# Patient Record
Sex: Male | Born: 1970 | Hispanic: No | Marital: Married | Smoking: Never smoker
Health system: Southern US, Community
[De-identification: ages and names within clinical notes are randomized; demographics above are authoritative.]

## PROBLEM LIST (undated history)

## (undated) DIAGNOSIS — F419 Anxiety disorder, unspecified: Secondary | ICD-10-CM

## (undated) HISTORY — DX: Anxiety disorder, unspecified: F41.9

## (undated) HISTORY — PX: WISDOM TOOTH EXTRACTION: SHX21

---

## 2008-12-24 ENCOUNTER — Ambulatory Visit: Payer: Self-pay | Admitting: Internal Medicine

## 2008-12-24 LAB — CONVERTED CEMR LAB
BUN: 14 mg/dL (ref 6–23)
CO2: 30 meq/L (ref 19–32)
Cholesterol: 159 mg/dL (ref 0–200)
GFR calc Af Amer: 97 mL/min
Glucose, Bld: 83 mg/dL (ref 70–99)
HDL: 40.3 mg/dL (ref >39.0)
Potassium: 4.1 meq/L (ref 3.5–5.1)
Sodium: 139 meq/L (ref 135–145)
VLDL: 9 mg/dL (ref 0–40)

## 2010-01-18 ENCOUNTER — Ambulatory Visit: Payer: Self-pay | Admitting: Internal Medicine

## 2010-12-25 ENCOUNTER — Ambulatory Visit: Admit: 2010-12-25 | Payer: Self-pay | Admitting: Internal Medicine

## 2010-12-28 ENCOUNTER — Ambulatory Visit: Admit: 2010-12-28 | Payer: Self-pay | Admitting: Internal Medicine

## 2011-01-03 NOTE — Assessment & Plan Note (Signed)
Summary: NAUSEA/PT STATES HE PASSED OUT LAST PM, BETTER TODAY, WANTS T...   Vital Signs:  Patient profile:   40 year old male Height:      68 inches Weight:      153 pounds BMI:     23.35 O2 Sat:      97 % on Room air Temp:     99.2 degrees F oral Pulse rate:   92 / minute BP sitting:   118 / 72  (left arm) Cuff size:   regular  Vitals Entered By: Bill Salinas CMA (January 18, 2010 11:15 AM)  O2 Flow:  Room air CC: pt here for evaluation after passing out last night. Pt states he had feeling of nausea with headache and chills/ ab   Primary Care Provider:  Jacques Navy MD  CC:  pt here for evaluation after passing out last night. Pt states he had feeling of nausea with headache and chills/ ab.  History of Present Illness: Yesterday was feeling a little nauseated and ill. He got home and felt weak. Took a nap, awoke and took a shower was feel a little better. He went back to bed and felt a little queasy, felt chilled, had a headache. He got up for tea and then felt really nauseous and weak, felt he couldn't hold himself up and then he collapsed, unconscious for 20-30 seconds and then passed out again but awoke immediately. He felt much better. Has done much better since.   Current Medications (verified): 1)  None  Allergies (verified): No Known Drug Allergies PMH-FH-SH reviewed-no changes except otherwise noted  Review of Systems       The patient complains of anorexia, syncope, headaches, and abdominal pain.  The patient denies fever, weight loss, weight gain, decreased hearing, chest pain, dyspnea on exertion, prolonged cough, incontinence, transient blindness, difficulty walking, and enlarged lymph nodes.    Physical Exam  General:  Well-developed,well-nourished,in no acute distress; alert,appropriate and cooperative throughout examination Head:  normocephalic and atraumatic.   Eyes:  pupils equal, pupils round, corneas and lenses clear, and no injection.   Neck:   supple and full ROM.   Lungs:  normal respiratory effort and normal breath sounds.   Heart:  normal rate and regular rhythm.   Abdomen:  soft, non-tender, and normal bowel sounds.   Neurologic:  alert & oriented X3, cranial nerves II-XII intact, strength normal in all extremities, gait normal, and DTRs symmetrical and normal.  Normal station, normal tandem gait.    Impression & Recommendations:  Problem # 1:  GASTROENTERITIS, VIRAL, ACUTE (ICD-008.8) Patient with a hx c/w viral gastroeneteritis with an episode of vaso-vagal syncope. He had a normal neuro exam. His symptoms are already much better.   Plan - no further workup           BRAT diet for today.            Reassurance

## 2011-02-06 ENCOUNTER — Other Ambulatory Visit: Payer: Self-pay | Admitting: Internal Medicine

## 2011-02-06 ENCOUNTER — Encounter (INDEPENDENT_AMBULATORY_CARE_PROVIDER_SITE_OTHER): Payer: Self-pay | Admitting: *Deleted

## 2011-02-06 ENCOUNTER — Other Ambulatory Visit: Payer: Self-pay

## 2011-02-06 DIAGNOSIS — Z0000 Encounter for general adult medical examination without abnormal findings: Secondary | ICD-10-CM

## 2011-02-06 LAB — CBC WITH DIFFERENTIAL/PLATELET
Basophils Absolute: 0 10*3/uL (ref 0.0–0.1)
Basophils Relative: 0.5 % (ref 0.0–3.0)
Eosinophils Absolute: 0.2 10*3/uL (ref 0.0–0.7)
Hemoglobin: 14.3 g/dL (ref 13.0–17.0)
Lymphs Abs: 1.8 10*3/uL (ref 0.7–4.0)
MCHC: 34.2 g/dL (ref 30.0–36.0)
MCV: 88.5 fl (ref 78.0–100.0)
Monocytes Absolute: 0.5 10*3/uL (ref 0.1–1.0)
Neutro Abs: 4.1 10*3/uL (ref 1.4–7.7)
RBC: 4.73 Mil/uL (ref 4.22–5.81)
RDW: 12.8 % (ref 11.5–14.6)

## 2011-02-06 LAB — LIPID PANEL: Total CHOL/HDL Ratio: 4

## 2011-02-06 LAB — BASIC METABOLIC PANEL
CO2: 32 mEq/L (ref 19–32)
Calcium: 9 mg/dL (ref 8.4–10.5)
Chloride: 103 mEq/L (ref 96–112)
Glucose, Bld: 80 mg/dL (ref 70–99)
Potassium: 4.7 mEq/L (ref 3.5–5.1)
Sodium: 140 mEq/L (ref 135–145)

## 2011-02-06 LAB — URINALYSIS
Hgb urine dipstick: NEGATIVE
Ketones, ur: NEGATIVE
Total Protein, Urine: NEGATIVE
Urine Glucose: NEGATIVE

## 2011-02-06 LAB — HEPATIC FUNCTION PANEL
ALT: 17 U/L (ref 0–53)
AST: 23 U/L (ref 0–37)
Alkaline Phosphatase: 57 U/L (ref 39–117)
Bilirubin, Direct: 0.1 mg/dL (ref 0.0–0.3)
Total Protein: 6.3 g/dL (ref 6.0–8.3)

## 2011-02-09 ENCOUNTER — Encounter: Payer: Self-pay | Admitting: Internal Medicine

## 2011-02-27 ENCOUNTER — Ambulatory Visit (INDEPENDENT_AMBULATORY_CARE_PROVIDER_SITE_OTHER): Payer: BC Managed Care – PPO | Admitting: Internal Medicine

## 2011-02-27 ENCOUNTER — Encounter: Payer: Self-pay | Admitting: Internal Medicine

## 2011-02-27 VITALS — BP 110/70 | HR 64 | Temp 97.6°F | Ht 68.0 in | Wt 148.0 lb

## 2011-02-27 DIAGNOSIS — Z0000 Encounter for general adult medical examination without abnormal findings: Secondary | ICD-10-CM

## 2011-02-27 DIAGNOSIS — Z23 Encounter for immunization: Secondary | ICD-10-CM

## 2011-02-27 NOTE — Progress Notes (Signed)
Subjective:    Patient ID: Charles Macdonald, male    DOB: [birth year], 40 y.o.   MRN: [id number]  HPI  Charles Macdonald presents for an annual medical physical exam. He is feeling well and doing well. He has no complaints.   No past medical history on file. Past Surgical History  Procedure Date  . Wisdom tooth extraction    Family History  Problem Relation Age of Onset  . Hyperlipidemia Mother   . Hypertension Father   . Hyperlipidemia Father   . Colon cancer Neg Hx   . Prostate cancer Neg Hx    History   Social History  . Marital Status: Married    Spouse Name: N/A    Number of Children: 1  . Years of Education: N/A   Occupational History  . [employer], [employer]    Social History Main Topics  . Smoking status: Never Smoker   . Smokeless tobacco: Never Used  . Alcohol Use: Yes  . Drug Use: No  . Sexually Active: Not on file   Other Topics Concern  . Not on file   Social History Narrative   Married '041 daughter '08      Review of Systems Review of Systems  Constitutional: Negative.  Negative for fever, chills, activity change and unexpected weight change.  HENT: Negative.  Negative for hearing loss, ear pain, congestion, neck stiffness and postnasal drip.   Eyes: Negative.  Negative for pain, discharge and visual disturbance.  Respiratory: Negative.  Negative for chest tightness and wheezing.   Cardiovascular: Negative.  Negative for chest pain and palpitations.       [No decreased exercise tolerance Gastrointestinal: Negative.        [No change in bowel habit. No bloating or gas. No reflux or indigestion Genitourinary: Negative.  Negative for urgency, frequency, flank pain and difficulty urinating.  Musculoskeletal: Negative.  Negative for myalgias, back pain, arthralgias and gait problem.  Neurological: Negative.  Negative for dizziness, tremors, weakness and headaches.  Hematological: Negative.  Negative for adenopathy.  Psychiatric/Behavioral:  Negative for behavioral problems and dysphoric mood.  [all other systems reviewed and are negative    Objective:   Physical Exam       Lab Results  Component Value Date   WBC 6.5 02/06/2011   HGB 14.3 02/06/2011   HCT 41.9 02/06/2011   PLT 163.0 02/06/2011   CHOL 147 02/06/2011   TRIG 46.0 02/06/2011   HDL 36.10* 02/06/2011   ALT 17 02/06/2011   AST 23 02/06/2011   NA 140 02/06/2011   K 4.7 02/06/2011   CL 103 02/06/2011   CREATININE 1.1 02/06/2011   BUN 21 02/06/2011   CO2 32 02/06/2011   TSH 1.90 02/06/2011   Lab Results  Component Value Date   LDLCALC 102* 02/06/2011   Constitutional: He is oriented to person, place, and time. He appears well-developed and well-nourished.       Healthy appearing  male in no acute distress  HENT:  Head: Normocephalic and atraumatic.  Right Ear: External ear normal.  Left Ear: External ear normal.  Nose: Nose normal.  Mouth/Throat: Oropharynx is clear and moist.  Eyes: Conjunctivae and EOM are normal. Pupils are equal, round, and reactive to light. Right eye exhibits no discharge. Left eye exhibits no discharge. No scleral icterus.  Neck: Normal range of motion. Neck supple. No JVD present. No tracheal deviation present. No thyromegaly present.  Cardiovascular: Normal rate, regular rhythm and normal heart sounds.  Exam reveals no gallop and no friction rub.   No murmur heard.      Quiet precordium. 2+ radial and DP pulses  Pulmonary/Chest: Effort normal. No respiratory distress. He has no wheezes. He has no rales. He exhibits no tenderness.       No chest wall deformity  Abdominal: Soft. Bowel sounds are normal. He exhibits no distension. There is no tenderness. There is no rebound and no guarding.       No heptosplenomegaly  Genitourinary: Penis normal.       Normal bilaterally descended testicles with no scrotal masses  Musculoskeletal: Normal range of motion. He exhibits no edema and no tenderness.       Small and large joints without redness, synovial  thickening or deformity. Full range of motion preserved about all small, median and large joints.  Lymphadenopathy:    He has no cervical adenopathy.  Neurological: He is alert and oriented to person, place, and time. He has normal reflexes. No cranial nerve deficit. Coordination normal.  Skin: Skin is warm and dry. No rash noted. No erythema.  Psychiatric: He has a normal mood and affect. His behavior is normal. Thought content normal.   Assessment & Plan:  1. Wellness exam - Charles Macdonald is in great condition and health. He invests in his care: healthy diet, balancing work and home, exercising on a regular basis. His lab work is within normal range including an LDL cholesterol that is well below goal of 130.  Plan - continue healthy life-style. Return for follow-up in 2 years with repeat lab in 3-5 years. He will otherwise return prn.   Note: reviewed Charles Macdonald's Jan '12 CT brain which was normal. An incidental finding was ethmoid sinus changes (disease) consistent with chronic allergies.

## 2011-02-28 MED ORDER — TETANUS-DIPHTH-ACELL PERTUSSIS 5-2.5-18.5 LF-MCG/0.5 IM SUSP
0.5000 mL | Freq: Once | INTRAMUSCULAR | Status: AC
  Administered 2011-02-27: 0.5 mL via INTRAMUSCULAR

## 2011-02-28 NOTE — Progress Notes (Signed)
Addended by: Lamar Sprinkles on: 02/28/2011 08:51 AM   Modules accepted: Orders

## 2011-02-28 NOTE — Progress Notes (Signed)
Addended by: Lamar Sprinkles on: 02/28/2011 09:38 AM   Modules accepted: Orders

## 2012-08-13 ENCOUNTER — Ambulatory Visit: Payer: BC Managed Care – PPO | Admitting: Endocrinology

## 2013-03-16 ENCOUNTER — Ambulatory Visit (INDEPENDENT_AMBULATORY_CARE_PROVIDER_SITE_OTHER): Payer: BC Managed Care – PPO | Admitting: Internal Medicine

## 2013-03-16 ENCOUNTER — Ambulatory Visit: Payer: BC Managed Care – PPO | Admitting: Internal Medicine

## 2013-03-16 ENCOUNTER — Encounter: Payer: Self-pay | Admitting: Internal Medicine

## 2013-03-16 VITALS — BP 118/82 | HR 70 | Temp 97.9°F | Ht 68.0 in | Wt 137.6 lb

## 2013-03-16 DIAGNOSIS — F411 Generalized anxiety disorder: Secondary | ICD-10-CM

## 2013-03-16 DIAGNOSIS — F419 Anxiety disorder, unspecified: Secondary | ICD-10-CM | POA: Insufficient documentation

## 2013-03-16 MED ORDER — TRAZODONE HCL 50 MG PO TABS: 25.0000 mg | ORAL_TABLET | Freq: Every evening | ORAL | Status: DC | PRN

## 2013-03-16 MED ORDER — SERTRALINE HCL 100 MG PO TABS: 100.0000 mg | ORAL_TABLET | Freq: Every day | ORAL | Status: DC

## 2013-03-16 MED ORDER — MELATONIN 3 MG PO TABS: 3.0000 mg | ORAL_TABLET | Freq: Every evening | ORAL | Status: DC | PRN

## 2013-03-16 MED ORDER — ALPRAZOLAM 1 MG PO TABS: 0.5000 mg | ORAL_TABLET | Freq: Three times a day (TID) | ORAL | Status: DC | PRN

## 2013-03-16 NOTE — Assessment & Plan Note (Signed)
Hx and sx reviewed in depth -support and encouragement offered pt feels symptoms driven by work stressors, denies specific precipitating event Reviewed mechanism of action for each drug and advised to give patient's sertraline more time as has not yet reached effective state (stared <1 week ago) Okay to use benzodiazepine as needed for panic attack spells, education provided to same and reviewed potential habit-forming nature Also encouraged use of non-habit-forming drugs such as trazodone or over-the-counter melatonin to help with sleep issues Discussed importance of counseling -has an appointment next week with Dr. Dellia Cloud for same Continue current prescribed medications, followup in 3 weeks to review symptoms and medication to adjust as needed Verified no SI/HI Parents remained present throughout the entirety of history and exam at patient request

## 2013-03-16 NOTE — Patient Instructions (Signed)
It was good to see you today. We have reviewed your prior records including labs and tests today Continue the Zoloft once daily and alprazolam as needed for anxiety symptoms Start trazodone for sleep or okay to try melatonin over-the-counter for sleep as discussed Your prescription(s) And refills have been submitted to your pharmacy. Please take as directed and contact our office if you believe you are having problem(s) with the medication(s). Keep appointment for counseling with Dr. Dellia Cloud next week as planned Followup in 3-4 weeks on symptoms and medication review, please call sooner if

## 2013-03-16 NOTE — Progress Notes (Signed)
  Subjective:    Patient ID: Charles Macdonald, male    DOB: 06-15-1971, 42 y.o.   MRN: [id number]  Anxiety Presents for initial visit. Onset was 1 to 6 months ago. The problem has been gradually worsening. Symptoms include decreased concentration, depressed mood, excessive worry, insomnia, irritability, malaise, muscle tension, nervous/anxious behavior and panic. Patient reports no compulsions, confusion, dizziness, dry mouth, nausea, obsessions, palpitations, restlessness, shortness of breath or suicidal ideas. Symptoms occur most days. The severity of symptoms is causing significant distress. The symptoms are aggravated by work stress. The quality of sleep is non-restorative. Nighttime awakenings: several.   Risk factors include family history.  There is no history of anemia, asthma, bipolar disorder, CAD, CHF, chronic lung disease, depression, fibromyalgia, hyperthyroidism or suicide attempts. Past treatments include nothing (seen [facility] because of sx - started SSRI and BZ "but no different yet"). The treatment provided no relief. Compliance with prior treatments has been good.   Past Medical History  Diagnosis Date  . Anxiety     Review of Systems  Constitutional: Positive for irritability and fatigue. Negative for fever and unexpected weight change.  Respiratory: Negative for shortness of breath.   Cardiovascular: Negative for palpitations.  Gastrointestinal: Negative for nausea.  Neurological: Negative for dizziness.  Psychiatric/Behavioral: Positive for decreased concentration. Negative for suicidal ideas and confusion. The patient is nervous/anxious and has insomnia.        Objective:   Physical Exam BP 118/82  Pulse 70  Temp(Src) 97.9 F (36.6 C) (Oral)  Ht 5\' 8"  (1.727 m)  Wt 137 lb 9.6 oz (62.415 kg)  BMI 20.93 kg/m2  SpO2 98% Wt Readings from Last 3 Encounters:  03/16/13 137 lb 9.6 oz (62.415 kg)  02/27/11 148 lb (67.132 kg)  01/18/10 153 lb (69.4 kg)    Constitutional: he appears well-developed and well-nourished. No distress. Parents at side (who are very concerned) Neck: Normal range of motion. Neck supple. No JVD present. No thyromegaly present.  Cardiovascular: Normal rate, regular rhythm and normal heart sounds.  No murmur heard. No BLE edema. Pulmonary/Chest: Effort normal and breath sounds normal. No respiratory distress. he has no wheezes. Neurological: he is alert and oriented to person, place, and time. No cranial nerve deficit. Coordination normal.  Psychiatric: he has a dysphoric and anxious mood/affect. behavior is normal. Judgment and thought content normal.  Lab Results  Component Value Date   WBC 6.5 02/06/2011   HGB 14.3 02/06/2011   HCT 41.9 02/06/2011   PLT 163.0 02/06/2011   GLUCOSE 80 02/06/2011   CHOL 147 02/06/2011   TRIG 46.0 02/06/2011   HDL 36.10* 02/06/2011   LDLCALC 102* 02/06/2011   ALT 17 02/06/2011   AST 23 02/06/2011   NA 140 02/06/2011   K 4.7 02/06/2011   CL 103 02/06/2011   CREATININE 1.1 02/06/2011   BUN 21 02/06/2011   CO2 32 02/06/2011   TSH 1.90 02/06/2011       Assessment & Plan:   See problem list. Medications and labs reviewed today.

## 2013-03-18 ENCOUNTER — Telehealth: Payer: Self-pay | Admitting: Internal Medicine

## 2013-03-18 DIAGNOSIS — F411 Generalized anxiety disorder: Secondary | ICD-10-CM

## 2013-03-18 NOTE — Telephone Encounter (Signed)
Patients mother is requesting referral to a psychiatrist for her son in addition to the referral to Dr. Dellia Cloud, she would like a few names so she can check to make sure they accept her insurance.

## 2013-03-18 NOTE — Telephone Encounter (Signed)
Refer to psychiatrist ordered - [facility] will call with names - thanks

## 2013-03-23 ENCOUNTER — Ambulatory Visit: Payer: BC Managed Care – PPO | Admitting: Psychology

## 2013-03-25 ENCOUNTER — Ambulatory Visit: Payer: BC Managed Care – PPO | Admitting: Psychology

## 2014-01-20 ENCOUNTER — Telehealth: Payer: Self-pay | Admitting: Internal Medicine

## 2014-01-20 NOTE — Telephone Encounter (Signed)
Patient would like to transfer, I filled out the transfer form in the notebook but he did want an appointment at this time. But he said if he needs to be set up with an appointment for establishment he said he would be willing to do so. Whatever he needs to do he doesn't mind doing it. Thank you! :)

## 2014-01-21 NOTE — Telephone Encounter (Signed)
Ok to schedule new pt visit with me at his convenience. Thanks.

## 2014-01-22 NOTE — Telephone Encounter (Signed)
lmom for pt to cb and schedule.

## 2014-02-01 NOTE — Telephone Encounter (Signed)
Pt has been sch for 02-18-14

## 2014-02-18 ENCOUNTER — Ambulatory Visit: Payer: BC Managed Care – PPO | Admitting: Family Medicine

## 2014-03-09 ENCOUNTER — Encounter: Payer: Self-pay | Admitting: Family Medicine

## 2014-03-09 ENCOUNTER — Ambulatory Visit (INDEPENDENT_AMBULATORY_CARE_PROVIDER_SITE_OTHER): Payer: BC Managed Care – PPO | Admitting: Family Medicine

## 2014-03-09 VITALS — BP 110/76 | Temp 98.0°F | Ht 68.0 in | Wt 154.0 lb

## 2014-03-09 DIAGNOSIS — Z0000 Encounter for general adult medical examination without abnormal findings: Secondary | ICD-10-CM

## 2014-03-09 DIAGNOSIS — F411 Generalized anxiety disorder: Secondary | ICD-10-CM

## 2014-03-09 DIAGNOSIS — E785 Hyperlipidemia, unspecified: Secondary | ICD-10-CM

## 2014-03-09 NOTE — Progress Notes (Signed)
Chief Complaint  Patient presents with  . Establish Care    HPI:  Charles Macdonald is here to establish care. Has had some anxiety issues on and off but does not wish to take medications for this and has been doing better. Reviewed last labs in 2012 and he wanted to know how to improve HDL. Last PCP and physical: 2012, he would like to go ahead and get preventive visit today.  Has the following chronic problems and concerns today:  Patient Active Problem List   Diagnosis Date Noted  . Anxiety    ROS: See pertinent positives and negatives per HPI.Review of Systems  Constitutional: Negative for malaise/fatigue.  HENT: Negative for hearing loss.   Eyes: Negative for blurred vision and double vision.  Respiratory: Negative for shortness of breath.   Cardiovascular: Negative for chest pain and palpitations.  Gastrointestinal: Negative for vomiting, diarrhea, blood in stool and melena.  Genitourinary: Negative for dysuria.  Musculoskeletal: Negative for falls.  Skin: Negative for rash.  Neurological: Negative for dizziness.  Psychiatric/Behavioral: Negative for depression. The patient is nervous/anxious.      Past Medical History  Diagnosis Date  . Anxiety     Family History  Problem Relation Age of Onset  . Hyperlipidemia Mother   . Cancer Mother 2    breast ICIS - good treatment results  . Hypertension Mother   . Hypertension Father   . Hyperlipidemia Father   . Colon cancer Neg Hx   . Prostate cancer Neg Hx   . Alcohol abuse Neg Hx   . COPD Neg Hx   . Diabetes Neg Hx     History   Social History  . Marital Status: Married    Spouse Name: N/A    Number of Children: 1  . Years of Education: 16   Occupational History  . [employer], [employer]    Social History Main Topics  . Smoking status: Never Smoker   . Smokeless tobacco: Never Used  . Alcohol Use: 1.2 oz/week    2 Glasses of wine per week     Comment: occ - 2 glasses on occ  . Drug  Use: No  . Sexual Activity: Yes   Other Topics Concern  . None   Social History Narrative   College educated in MIS.  Married '04  1 daughter '08.  Work is going well.  Marriage is in good health.      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  EXAM:  Filed Vitals:   03/09/14 1602  BP: 110/76  Temp: 98 F (36.7 C)    Body mass index is 23.42 kg/(m^2).  GENERAL: vitals reviewed and listed above, alert, oriented, appears well hydrated and in no acute distress  HEENT: atraumatic, conjunttiva clear, no obvious abnormalities on inspection of external nose and ears  NECK: no obvious masses on inspection  LUNGS: clear to auscultation bilaterally, no wheezes, rales or rhonchi, good air movement  CV: HRRR, no peripheral edema  MS: moves all extremities without noticeable abnormality  PSYCH: pleasant and cooperative, no obvious depression or anxiety  ASSESSMENT AND PLAN:  Discussed the following assessment and plan:  Visit for preventive health examination - Plan: Lipid Panel, Hemoglobin A1c  Dyslipidemia  Generalized anxiety disorder  -We reviewed the PMH, PSH, FH, SH, Meds and Allergies. -We provided refills for any medications we will prescribe as needed. -We addressed current concerns per orders and patient instructions. -We have asked for records for pertinent exams, studies, vaccines and notes from previous providers. -We have advised patient to follow up per instructions below. -USPSTF level a and b recs discussed -NON-FASTING labs today -discussed healthy diet and exercise for CV health and stress management -follow up yearly  -Patient advised to return or notify a doctor immediately if symptoms worsen or persist or  new concerns arise.  There are no Patient Instructions on file for this visit.   Kriste BasqueKIM, HANNAH R.

## 2014-03-09 NOTE — Progress Notes (Signed)
Pre visit review using our clinic review tool, if applicable. No additional management support is needed unless otherwise documented below in the visit note. 

## 2014-03-10 LAB — LIPID PANEL
CHOLESTEROL: 196 mg/dL (ref 0–200)
HDL: 46.6 mg/dL (ref >39.00)
LDL Cholesterol: 119 mg/dL — ABNORMAL HIGH (ref 0–99)
Total CHOL/HDL Ratio: 4
Triglycerides: 152 mg/dL — ABNORMAL HIGH (ref 0.0–149.0)
VLDL: 30.4 mg/dL (ref 0.0–40.0)

## 2014-03-10 LAB — HEMOGLOBIN A1C: Hgb A1c MFr Bld: 5.6 % (ref 4.6–6.5)

## 2015-11-08 ENCOUNTER — Telehealth: Payer: Self-pay | Admitting: Family Medicine

## 2015-11-08 ENCOUNTER — Encounter: Payer: Self-pay | Admitting: Family Medicine

## 2015-11-08 ENCOUNTER — Ambulatory Visit (INDEPENDENT_AMBULATORY_CARE_PROVIDER_SITE_OTHER)
Admission: RE | Admit: 2015-11-08 | Discharge: 2015-11-08 | Disposition: A | Discharge status: Home / Self Care | Payer: 59 | Source: Ambulatory Visit | Attending: Family Medicine | Admitting: Family Medicine

## 2015-11-08 ENCOUNTER — Ambulatory Visit (INDEPENDENT_AMBULATORY_CARE_PROVIDER_SITE_OTHER): Payer: 59 | Admitting: Family Medicine

## 2015-11-08 VITALS — BP 110/70 | HR 76 | Temp 98.0°F | Ht 68.0 in | Wt 150.9 lb

## 2015-11-08 DIAGNOSIS — M542 Cervicalgia: Secondary | ICD-10-CM

## 2015-11-08 DIAGNOSIS — M792 Neuralgia and neuritis, unspecified: Secondary | ICD-10-CM

## 2015-11-08 DIAGNOSIS — M546 Pain in thoracic spine: Secondary | ICD-10-CM | POA: Diagnosis not present

## 2015-11-08 NOTE — Progress Notes (Signed)
HPI:  Charles Macdonald is a very pleasant 44 yo old with a PMH significant for Anxiety here for an acute visit for:  Neck pain with radicular symptoms: -started about 2-3 weeks ago with mild-mod intermittent pain in neck/upper back -now with some intermittent tingling pain down ulnar aspect of arm to fingers that started while sleeping last night, improving now -denies: weakness, numbness, malaise, fevers, chills, trauma -reports longstanding intermittent neck pain issues that resolve with essential oils and stretching -he really prefers to avoid medication if possible   ROS: See pertinent positives and negatives per HPI.  Past Medical History  Diagnosis Date  . Anxiety     Past Surgical History  Procedure Laterality Date  . Wisdom tooth extraction      Family History  Problem Relation Age of Onset  . Hyperlipidemia Mother   . Cancer Mother 63    breast ICIS - good treatment results  . Hypertension Mother   . Hypertension Father   . Hyperlipidemia Father   . Colon cancer Neg Hx   . Prostate cancer Neg Hx   . Alcohol abuse Neg Hx   . COPD Neg Hx   . Diabetes Neg Hx     Social History   Social History  . Marital Status: Married    Spouse Name: N/A  . Number of Children: 1  . Years of Education: 16   Occupational History  . [employer], [employer]    Social History Main Topics  . Smoking status: Never Smoker   . Smokeless tobacco: Never Used  . Alcohol Use: 1.2 oz/week    2 Glasses of wine per week     Comment: occ - 2 glasses on occ  . Drug Use: No  . Sexual Activity: Yes   Other Topics Concern  . None   Social History Narrative   College educated in MIS.  Married '04  1 daughter '08.  Work is going well.  Marriage is in good health.      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  EXAM:  Filed Vitals:   11/08/15 1451  BP: 110/70  Pulse: 76  Temp: 98 F (36.7 C)    Body mass index is 22.95 kg/(m^2).  GENERAL: vitals reviewed and listed above, alert, oriented, appears well hydrated and in no acute distress  HEENT: atraumatic, conjunttiva clear, no obvious abnormalities on inspection of external nose and ears  NECK: no obvious masses on inspection, no bony TTP  LUNGS: clear to auscultation bilaterally, no wheezes, rales or rhonchi, good air movement  CV: HRRR, no peripheral edema  MS/NEURO: moves all extremities without noticeable abnormality, normal inspection of head, neck, back - no rash, mild TTP in cervical paraspinal muscles, normal ROM, strength, sensation to light touch in shoulder, neck, UE bilat  PSYCH: pleasant and cooperative, no obvious depression, anxious mood  ASSESSMENT AND PLAN:  Discussed the following assessment and plan:  Neck pain - Plan: DG Cervical Spine Complete  Left-sided thoracic back pain  Radicular pain in left arm - Plan: DG Cervical Spine Complete  -we discussed possible  serious and likely etiologies, workup and treatment, treatment risks and return precautions -after this discussion, Charles Macdonald opted for plain films after sig persuasion (he prefers to avoid studies and meds when possible) of neck, advised if persistent radicular symptoms may need MRI as weeks -he declines medications -follow up advised in 3-4 weeks -of course, we advised Charles Macdonald  to return or notify a doctor immediately if symptoms worsen or persist or new concerns arise.  -Patient advised to return or notify a doctor immediately if symptoms worsen or persist or new concerns arise.  Patient Instructions  BEFORE YOU LEAVE: -xray sheet -follow up appointment in 3-4 weeks -neck exercises  Go get the xray today - if ok we will start the exercises 4 days per  week  Follow up in 3-4 weeks or sooner if any worsening of symptoms     KIM, HANNAH R.

## 2015-11-08 NOTE — Telephone Encounter (Signed)
 [facility] - Client TELEPHONE ADVICE RECORD [facility] Patient Name: Charles Macdonald DOB: 12/05/1970 Initial Comment Caller states he had a back pain, a couples of weeks. He went away. Now he's having pain behind his arm, and some tingling and numbness in his fingers. Nurse Assessment Nurse: Roderic OvensNorth, RN, Amy Date/Time Charles Macdonald(Eastern Time): 11/08/2015 9:04:21 AM Confirm and document reason for call. If symptomatic, describe symptoms. ---A COUPLE OF WEEKS AGO HE WAS HAVING SOME BACK PAIN. NOW HE IS HAVING SOME ARM PIT AREA PAIN. NOT CONSTANT. HE STATES THAT HE FEELS THIS IS ALL CONNECTED TO THE NECK AREA. OVER THE PAST 24 HOURS HE WAS HAVING SOME MINOR NUMBNESS AND TINGLING IN THE ARM. HE HAS BEEN USING ESSENTIAL OILS AND HEATING PAD. THE MINOR NUMBNESS AND TINGLING AND SHOOTING PAINS AT CERTAIN POSITIONS. HE FEELS LIKE HE MAY HAVE A PINCHED NERVE. Has the patient traveled out of the country within the last 30 days? ---Not Applicable Does the patient have any new or worsening symptoms? ---Yes Will a triage be completed? ---Yes Related visit to physician within the last 2 weeks? ---No Does the PT have any chronic conditions? (i.e. diabetes, asthma, etc.) ---No Is this a behavioral health or substance abuse call? ---No Guidelines Guideline Title Affirmed Question Affirmed Notes Neurologic Deficit [1] Tingling (e.g., pins and needles) of the face, arm / hand, or leg / foot on one side of the body AND [2] present now Final Disposition User See Physician within 4 Hours (or PCP triage) Roderic OvensNorth, RN, Amy Referrals REFERRED TO PCP OFFICE REFERRED TO PCP OFFICE Disagree/Comply: Comply

## 2015-11-08 NOTE — Progress Notes (Signed)
Pre visit review using our clinic review tool, if applicable. No additional management support is needed unless otherwise documented below in the visit note. 

## 2015-11-08 NOTE — Telephone Encounter (Signed)
Appointment scheduled today with Dr. Selena BattenKim

## 2015-11-08 NOTE — Patient Instructions (Signed)
BEFORE YOU LEAVE: -xray sheet -follow up appointment in 3-4 weeks -neck exercises  Go get the xray today - if ok we will start the exercises 4 days per week  Follow up in 3-4 weeks or sooner if any worsening of symptoms

## 2015-11-11 ENCOUNTER — Telehealth: Payer: Self-pay | Admitting: *Deleted

## 2015-11-11 NOTE — Telephone Encounter (Signed)
Patient called regarding the results in which Dr Selena BattenKim recommended home exercises or PT.  Patient stated he wants to try massage therapy and I advised him Dr Selena BattenKim recommended formal PT and if this is something the PT would do after seeing him they may do this but not massage only.  He stated he would like for me to set this up for him and where do we refer pts for this and I advised him [facility] and someone will call him with appt info.  He stated he prefers to talk with someone there first to see what they would do and I advised him I am not sure they can do this without seeing him as they usually see the pt first for an evaluation and he declined the referral.

## 2015-11-14 NOTE — Telephone Encounter (Signed)
Patient informed on 12/9.

## 2015-11-14 NOTE — Telephone Encounter (Signed)
Ok. Have him follow up in 1 month - sooner if worsening.

## 2016-05-10 IMAGING — DX DG CERVICAL SPINE COMPLETE 4+V
5 series · 5 of 5 positions shown · non-contrast
Comparison: None.

CLINICAL DATA: Upper left shoulder and mid back pain left arm
numbness and tingling no known injury

EXAM:
CERVICAL SPINE - COMPLETE 4+ VIEW

[c-spine lat]
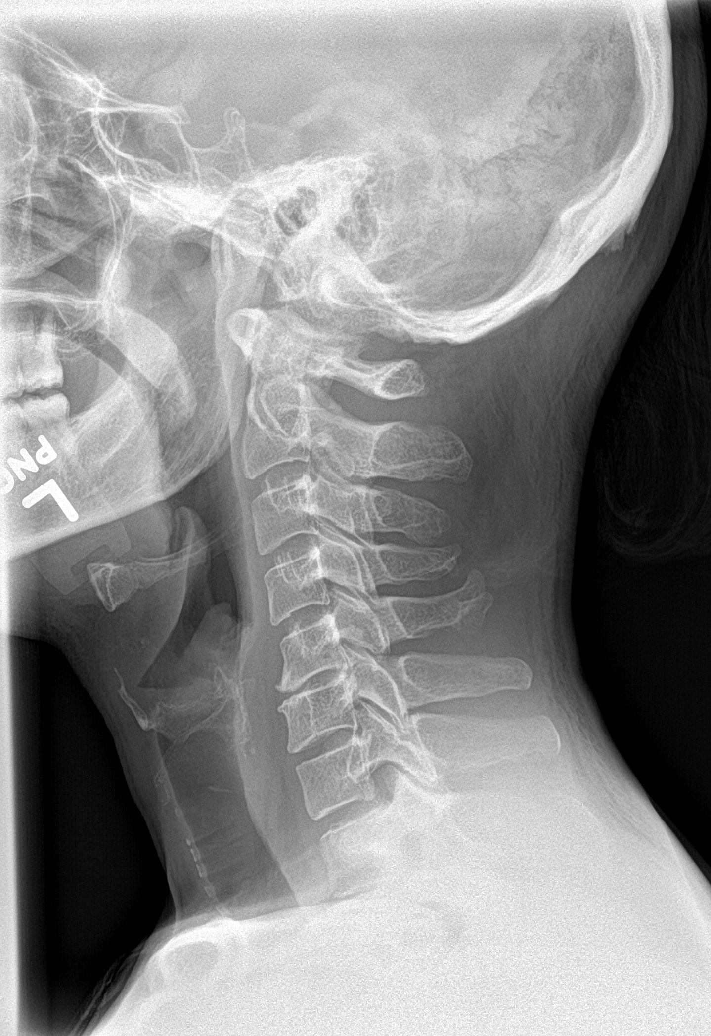

[c-spine obl (1 of 2)]
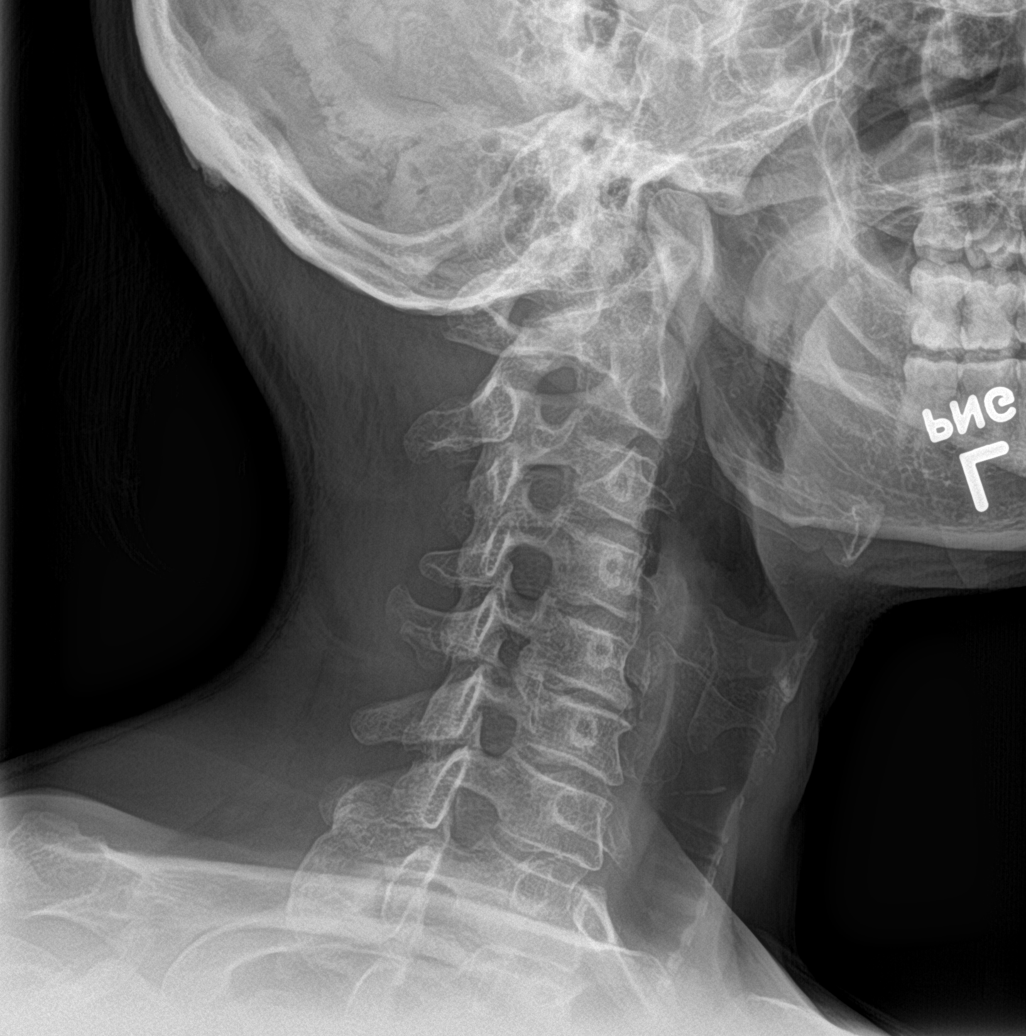

[c-spine obl (2 of 2)]
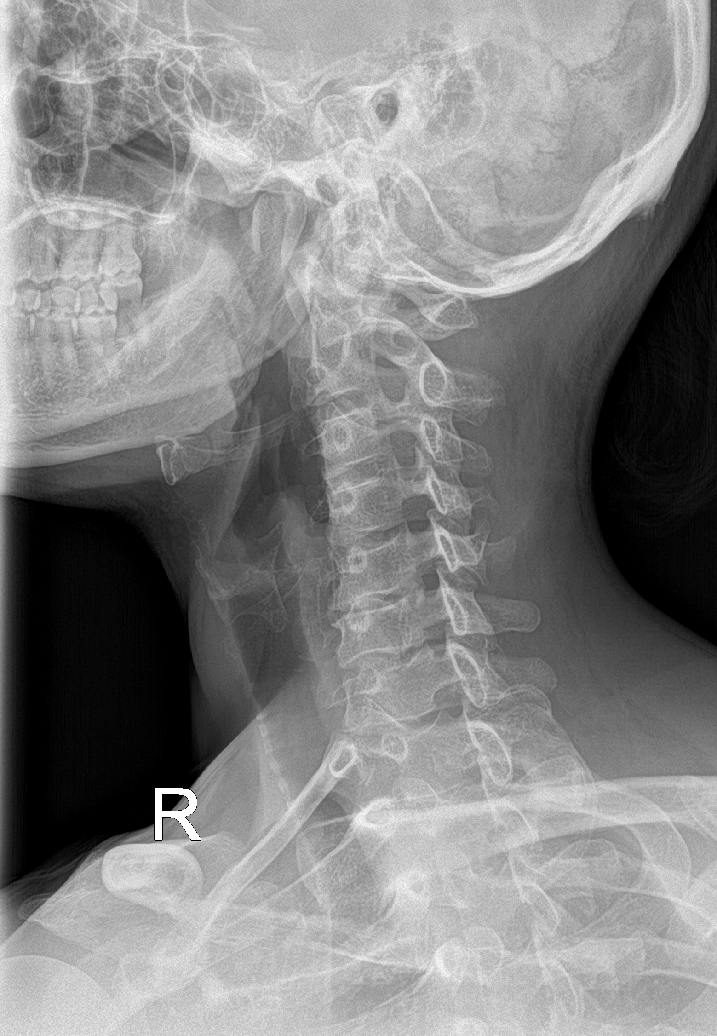

[c-spine ap]
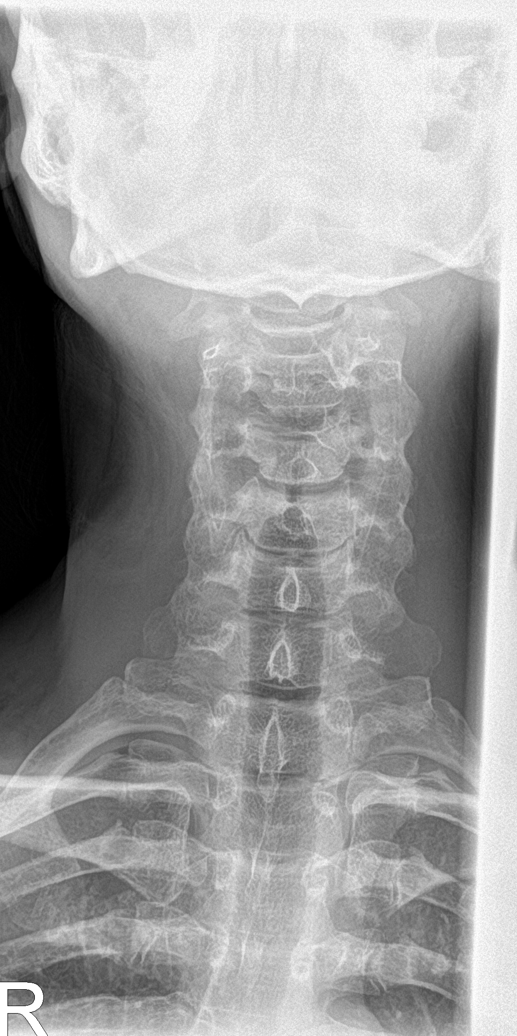

[c-spine open mouth]
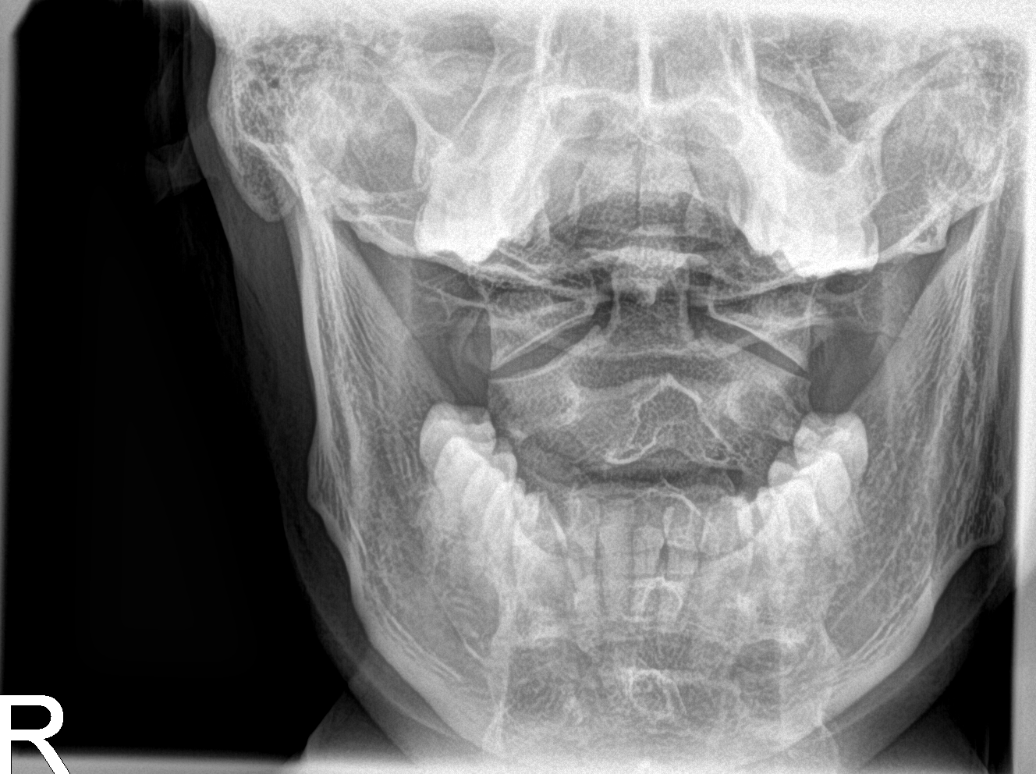

[5 of 5 positions shown; findings below may reference images not displayed]

FINDINGS: Normal alignment with no fracture. Moderate C5-6 degenerative disc
disease. Additionally, uncovertebral hypertrophy at this level is
noted. There is bilateral mild to moderate C5-6 foraminal narrowing.
IMPRESSION: Degenerative changes at the C5-6 level.
